# Patient Record
Sex: Female | Born: 1990 | Race: Black or African American | Marital: Single | Smoking: Never smoker
Health system: Southern US, Community
[De-identification: ages and names within clinical notes are randomized; demographics above are authoritative.]

## PROBLEM LIST (undated history)

## (undated) DIAGNOSIS — M228X9 Other disorders of patella, unspecified knee: Secondary | ICD-10-CM

## (undated) DIAGNOSIS — K219 Gastro-esophageal reflux disease without esophagitis: Secondary | ICD-10-CM

## (undated) DIAGNOSIS — M9420 Chondromalacia, unspecified site: Secondary | ICD-10-CM

## (undated) HISTORY — DX: Gastro-esophageal reflux disease without esophagitis: K21.9

## (undated) HISTORY — PX: ADENOIDECTOMY: SUR15

## (undated) HISTORY — DX: Chondromalacia, unspecified site: M94.20

## (undated) HISTORY — PX: TONSILLECTOMY: SUR1361

## (undated) HISTORY — DX: Other disorders of patella, unspecified knee: M22.8X9

---

## 2014-12-02 ENCOUNTER — Emergency Department (INDEPENDENT_AMBULATORY_CARE_PROVIDER_SITE_OTHER): Payer: 59

## 2014-12-02 ENCOUNTER — Emergency Department (INDEPENDENT_AMBULATORY_CARE_PROVIDER_SITE_OTHER)
Admission: EM | Admit: 2014-12-02 | Discharge: 2014-12-02 | Disposition: A | Discharge status: Home / Self Care | Payer: 59 | Source: Home / Self Care | Attending: Family Medicine | Admitting: Family Medicine

## 2014-12-02 ENCOUNTER — Encounter: Payer: Self-pay | Admitting: *Deleted

## 2014-12-02 DIAGNOSIS — R072 Precordial pain: Secondary | ICD-10-CM | POA: Diagnosis not present

## 2014-12-02 DIAGNOSIS — K219 Gastro-esophageal reflux disease without esophagitis: Secondary | ICD-10-CM

## 2014-12-02 DIAGNOSIS — R0789 Other chest pain: Secondary | ICD-10-CM

## 2014-12-02 MED ORDER — OMEPRAZOLE 40 MG PO CPDR: 40.0000 mg | DELAYED_RELEASE_CAPSULE | Freq: Every day | ORAL | Status: AC

## 2014-12-02 NOTE — ED Notes (Signed)
Pt has had intermittent sharp pains in her chest.  4-6 times a day lasting less than a minute on the center of her chest.  Started 2 days ago.  Denies any other symptoms when the pains occur.

## 2014-12-02 NOTE — Discharge Instructions (Signed)
Thank you for coming in today. Call or go to the emergency room if you get worse, have trouble breathing, have chest pains, or palpitations.  Follow up with your doctor.  Take omeprazole daily.   Chest Pain (Nonspecific) It is often hard to give a specific diagnosis for the cause of chest pain. There is always a chance that your pain could be related to something serious, such as a heart attack or a blood clot in the lungs. You need to follow up with your health care provider for further evaluation. CAUSES   Heartburn.  Pneumonia or bronchitis.  Anxiety or stress.  Inflammation around your heart (pericarditis) or lung (pleuritis or pleurisy).  A blood clot in the lung.  A collapsed lung (pneumothorax). It can develop suddenly on its own (spontaneous pneumothorax) or from trauma to the chest.  Shingles infection (herpes zoster virus). The chest wall is composed of bones, muscles, and cartilage. Any of these can be the source of the pain.  The bones can be bruised by injury.  The muscles or cartilage can be strained by coughing or overwork.  The cartilage can be affected by inflammation and become sore (costochondritis). DIAGNOSIS  Lab tests or other studies may be needed to find the cause of your pain. Your health care provider may have you take a test called an ambulatory electrocardiogram (ECG). An ECG records your heartbeat patterns over a 24-hour period. You may also have other tests, such as:  Transthoracic echocardiogram (TTE). During echocardiography, sound waves are used to evaluate how blood flows through your heart.  Transesophageal echocardiogram (TEE).  Cardiac monitoring. This allows your health care provider to monitor your heart rate and rhythm in real time.  Holter monitor. This is a portable device that records your heartbeat and can help diagnose heart arrhythmias. It allows your health care provider to track your heart activity for several days, if  needed.  Stress tests by exercise or by giving medicine that makes the heart beat faster. TREATMENT   Treatment depends on what may be causing your chest pain. Treatment may include:  Acid blockers for heartburn.  Anti-inflammatory medicine.  Pain medicine for inflammatory conditions.  Antibiotics if an infection is present.  You may be advised to change lifestyle habits. This includes stopping smoking and avoiding alcohol, caffeine, and chocolate.  You may be advised to keep your head raised (elevated) when sleeping. This reduces the chance of acid going backward from your stomach into your esophagus. Most of the time, nonspecific chest pain will improve within 2-3 days with rest and mild pain medicine.  HOME CARE INSTRUCTIONS   If antibiotics were prescribed, take them as directed. Finish them even if you start to feel better.  For the next few days, avoid physical activities that bring on chest pain. Continue physical activities as directed.  Do not use any tobacco products, including cigarettes, chewing tobacco, or electronic cigarettes.  Avoid drinking alcohol.  Only take medicine as directed by your health care provider.  Follow your health care provider's suggestions for further testing if your chest pain does not go away.  Keep any follow-up appointments you made. If you do not go to an appointment, you could develop lasting (chronic) problems with pain. If there is any problem keeping an appointment, call to reschedule. SEEK MEDICAL CARE IF:   Your chest pain does not go away, even after treatment.  You have a rash with blisters on your chest.  You have a fever. SEEK IMMEDIATE  MEDICAL CARE IF:   You have increased chest pain or pain that spreads to your arm, neck, jaw, back, or abdomen.  You have shortness of breath.  You have an increasing cough, or you cough up blood.  You have severe back or abdominal pain.  You feel nauseous or vomit.  You have severe  weakness.  You faint.  You have chills. This is an emergency. Do not wait to see if the pain will go away. Get medical help at once. Call your local emergency services (911 in [place] Do not drive yourself to the hospital. MAKE SURE YOU:   Understand these instructions.  Will watch your condition.  Will get help right away if you are not doing well or get worse. Document Released: 07/23/2005 Document Revised: 10/18/2013 Document Reviewed: 05/18/2008 [facility] Patient Information 2015 [place], [place]. This information is not intended to replace advice given to you by your health care provider. Make sure you discuss any questions you have with your health care provider.

## 2014-12-02 NOTE — ED Provider Notes (Signed)
Mallory Huynh is a 24 y.o. female who presents to Urgent Care today for chest pain. Patient has a three-day history of sharp central intermittent chest pain. The pain is not exertional. Patient does note some acid reflux. The pain occurs several times per day and lasts between 30 and 60 seconds. The pain is not associated with palpitations lightheadedness or shortness of breath. No leg swelling or recent immobility. Patient notes that she takes diclofenac twice daily for the last 2 months for left knee pain.   History reviewed. No pertinent past medical history. Past Surgical History  Procedure Laterality Date  . Tonsillectomy    . Adenoidectomy     History  Substance Use Topics  . Smoking status: Never Smoker   . Smokeless tobacco: Never Used  . Alcohol Use: Yes   ROS as above Medications: No current facility-administered medications for this encounter.   Current Outpatient Prescriptions  Medication Sig Dispense Refill  . diclofenac (CATAFLAM) 50 MG tablet Take 50 mg by mouth 2 (two) times daily.    . Probiotic Product (PROBIOTIC DAILY PO) Take by mouth.    Marland Kitchen. omeprazole (PRILOSEC) 40 MG capsule Take 1 capsule (40 mg total) by mouth daily. 30 capsule 1   No Known Allergies   Exam:  BP 122/75 mmHg  Pulse 59  Temp(Src) 98.3 F (36.8 C) (Oral)  Ht 5\' 4"  (1.626 m)  Wt 180 lb (81.647 kg)  BMI 30.88 kg/m2  SpO2 100%  LMP 11/26/2014 Gen: Well NAD HEENT: EOMI,  MMM Lungs: Normal work of breathing. CTABL Heart: RRR no MRG Abd: NABS, Soft. Nondistended, Nontender Exts: Brisk capillary refill, warm and well perfused. No leg edema or calf tenderness bilaterally  Twelve-lead EKG shows sinus bradycardia at 54 bpm. No ST segment elevation or depression. No significant Q waves. QTC 392. Normal EKG.  No results found for this or any previous visit (from the past 24 hour(s)). Dg Chest 2 View  12/02/2014   CLINICAL DATA:  Intermittent chest pain in the mid sternal area for 3 days,  without radiation.  EXAM: CHEST  2 VIEW  COMPARISON:  None.  FINDINGS: Trachea is midline. Heart size normal. Lungs are clear. No pleural fluid.  IMPRESSION: Negative.   Electronically Signed   By: Leanna BattlesMelinda  Blietz M.D.   On: 12/02/2014 15:41    Assessment and Plan: 24 y.o. female with intermittent atypical chest pain. Unclear etiology likely reflux. Recommend discontinue diclofenac and start omeprazole. Follow-up with PCP.  Discussed warning signs or symptoms. Please see discharge instructions. Patient expresses understanding.     Rodolph BongEvan S Corey, MD 12/02/14 1556

## 2014-12-04 ENCOUNTER — Telehealth: Payer: Self-pay | Admitting: Emergency Medicine

## 2014-12-04 NOTE — ED Notes (Signed)
Inquired about patient's status; encourage them to call with questions/concerns.  

## 2015-01-09 ENCOUNTER — Encounter: Payer: Self-pay | Admitting: Family

## 2015-01-09 ENCOUNTER — Ambulatory Visit (INDEPENDENT_AMBULATORY_CARE_PROVIDER_SITE_OTHER): Payer: 59 | Admitting: Family

## 2015-01-09 VITALS — BP 138/82 | HR 66 | Temp 97.6°F | Resp 18 | Ht 64.0 in | Wt 176.8 lb

## 2015-01-09 DIAGNOSIS — R635 Abnormal weight gain: Secondary | ICD-10-CM

## 2015-01-09 NOTE — Patient Instructions (Signed)
Thank you for choosing [facility].  Summary/Instructions:  Please schedule a time for your physical.   Recommend % body fat testing.

## 2015-01-09 NOTE — Progress Notes (Signed)
   Subjective:    Patient ID: Mallory Huynh, female    DOB: 10/06/1991, 24 y.o.   MRN: [id number]  Chief Complaint  Patient presents with  . Establish Care    weight gain, working out and eating healthy     HPI:  Mallory MantisKiara k Deeney is a 24 y.o. female who presents today to establish care and discuss weight gain.  This is a new problem. Associated symptom of weight gain of approximately 16 pounds in about a year. Notes that about 3 years ago she weighed 204 and then was down to 159 for several months and is now ranging in the 175 area with a plus or minus 2-3 pounds. Nutrition consists of lean meats, fruits, vegetables, decreased saturated fat, limited fast foods and processed foods. Notes limited caffeine. Works out 4-5 times per week with a mixture of cardio and resistance training with the average session lasting 2-3 hours.   No Known Allergies  Current Outpatient Prescriptions on File Prior to Visit  Medication Sig Dispense Refill  . omeprazole (PRILOSEC) 40 MG capsule Take 1 capsule (40 mg total) by mouth daily. 30 capsule 1  . Probiotic Product (PROBIOTIC DAILY PO) Take by mouth.     No current facility-administered medications on file prior to visit.    Past Medical History  Diagnosis Date  . GERD (gastroesophageal reflux disease)   . Chondromalacia   . Patellar tracking disorder     Past Surgical History  Procedure Laterality Date  . Tonsillectomy    . Adenoidectomy      Family History  Problem Relation Age of Onset  . Hypertension Mother   . Diabetes Father   . Alzheimer's disease Maternal Grandmother   . Lung cancer Maternal Grandfather   . Diabetes Paternal Grandmother   . Lung cancer Paternal Grandfather     History   Social History  . Marital Status: Single    Spouse Name: N/A  . Number of Children: N/A  . Years of Education: N/A   Occupational History  . Not on file.   Social History Main Topics  . Smoking status: Never Smoker   . Smokeless  tobacco: Never Used  . Alcohol Use: Yes     Comment: occasionally  . Drug Use: No  . Sexual Activity: Yes    Birth Control/ Protection: None   Other Topics Concern  . Not on file   Social History Narrative    Review of Systems  Constitutional: Positive for activity change. Negative for appetite change.      Objective:    BP 138/82 mmHg  Pulse 66  Temp(Src) 97.6 F (36.4 C) (Oral)  Resp 18  Ht 5\' 4"  (1.626 m)  Wt 176 lb 12.8 oz (80.196 kg)  BMI 30.33 kg/m2  SpO2 99% Nursing note and vital signs reviewed.  Physical Exam  Constitutional: She is oriented to person, place, and time. She appears well-developed and well-nourished. No distress.  Cardiovascular: Normal rate, regular rhythm, normal heart sounds and intact distal pulses.   Pulmonary/Chest: Effort normal and breath sounds normal.  Neurological: She is alert and oriented to person, place, and time.  Skin: Skin is warm and dry.  Psychiatric: She has a normal mood and affect. Her behavior is normal. Judgment and thought content normal.       Assessment & Plan:

## 2015-01-09 NOTE — Assessment & Plan Note (Signed)
Weight gain is a result of decreased physical activity from 4-5 hours per day down to 2-3 hours per day. Patient's BMI indicates obesity, however given patient's muscular mass doubt that this is an accurate assessment of her current weight. Recommend percent body fat testing to determine her current status. Discussed increasing caloric intake to meet metabolic demands and needs. Continue current healthy lifestyle trends. We'll check lab work at her annual physical.

## 2015-01-09 NOTE — Progress Notes (Signed)
Pre visit review using our clinic review tool, if applicable. No additional management support is needed unless otherwise documented below in the visit note. 

## 2015-01-10 ENCOUNTER — Ambulatory Visit (INDEPENDENT_AMBULATORY_CARE_PROVIDER_SITE_OTHER): Payer: 59 | Admitting: Family

## 2015-01-10 ENCOUNTER — Encounter: Payer: Self-pay | Admitting: Family

## 2015-01-10 VITALS — BP 124/72 | HR 59 | Temp 98.3°F | Resp 18 | Ht 64.0 in | Wt 176.0 lb

## 2015-01-10 DIAGNOSIS — Z0000 Encounter for general adult medical examination without abnormal findings: Secondary | ICD-10-CM

## 2015-01-10 NOTE — Progress Notes (Signed)
Pre visit review using our clinic review tool, if applicable. No additional management support is needed unless otherwise documented below in the visit note. 

## 2015-01-10 NOTE — Assessment & Plan Note (Signed)
1) Anticipatory Guidance: Discussed importance of wearing a seatbelt while driving and not texting while driving; changing batteries in smoke detector at least once annually; wearing suntan lotion when outside; eating a balanced and moderate diet; getting physical activity at least 30 minutes per day.  2) Immunizations / Screenings / Labs:  Discussed Gardasil with patient. Indicates urinary has completed it. All immunizations are up-to-date per recommendations. All screenings are up-to-date per recommendations.Obtain CBC, BMET, Lipid profile, HIV and TSH.    Overall well exam. Patient has minimal risk factors at this time. Continue current healthy lifestyle and exercise choices. BMI indicates patient is overweight however she is of significant muscle mass. Will work to find percent body fat testing for the patient. Follow-up prevention exam in one year.

## 2015-01-10 NOTE — Patient Instructions (Signed)
Thank you for choosing [facility].  Summary/Instructions:  Please stop by the lab on the basement level of the building for your blood work. Your results will be released to MyChart (or called to you) after review, usually within 72hours after test completion. If any changes need to be made, you will be notified at that same time.  Health Maintenance Adopting a healthy lifestyle and getting preventive care can go a long way to promote health and wellness. Talk with your health care provider about what schedule of regular examinations is right for you. This is a good chance for you to check in with your provider about disease prevention and staying healthy. In between checkups, there are plenty of things you can do on your own. Experts have done a lot of research about which lifestyle changes and preventive measures are most likely to keep you healthy. Ask your health care provider for more information. WEIGHT AND DIET  Eat a healthy diet  Be sure to include plenty of vegetables, fruits, low-fat dairy products, and lean protein.  Do not eat a lot of foods high in solid fats, added sugars, or salt.  Get regular exercise. This is one of the most important things you can do for your health.  Most adults should exercise for at least 150 minutes each week. The exercise should increase your heart rate and make you sweat (moderate-intensity exercise).  Most adults should also do strengthening exercises at least twice a week. This is in addition to the moderate-intensity exercise.  Maintain a healthy weight  Body mass index (BMI) is a measurement that can be used to identify possible weight problems. It estimates body fat based on height and weight. Your health care provider can help determine your BMI and help you achieve or maintain a healthy weight.  For females 20 years of age and older:   A BMI below 18.5 is considered underweight.  A BMI of 18.5 to 24.9 is normal.  A BMI of 25  to 29.9 is considered overweight.  A BMI of 30 and above is considered obese.  Watch levels of cholesterol and blood lipids  You should start having your blood tested for lipids and cholesterol at 24 years of age, then have this test every 5 years.  You may need to have your cholesterol levels checked more often if:  Your lipid or cholesterol levels are high.  You are older than 24 years of age.  You are at high risk for heart disease.  CANCER SCREENING   Lung Cancer  Lung cancer screening is recommended for adults 55-80 years old who are at high risk for lung cancer because of a history of smoking.  A yearly low-dose CT scan of the lungs is recommended for people who:  Currently smoke.  Have quit within the past 15 years.  Have at least a 30-pack-year history of smoking. A pack year is smoking an average of one pack of cigarettes a day for 1 year.  Yearly screening should continue until it has been 15 years since you quit.  Yearly screening should stop if you develop a health problem that would prevent you from having lung cancer treatment.  Breast Cancer  Practice breast self-awareness. This means understanding how your breasts normally appear and feel.  It also means doing regular breast self-exams. Let your health care provider know about any changes, no matter how small.  If you are in your 20s or 30s, you should have a clinical breast exam (  breast exam (CBE) by a health care provider every 1-3 years as part of a regular health exam.  If you are 40 or older, have a CBE every year. Also consider having a breast X-ray (mammogram) every year.  If you have a family history of breast cancer, talk to your health care provider about genetic screening.  If you are at high risk for breast cancer, talk to your health care provider about having an MRI and a mammogram every year.  Breast cancer gene (BRCA) assessment is recommended for women who have family members with BRCA-related  cancers. BRCA-related cancers include:  Breast.  Ovarian.  Tubal.  Peritoneal cancers.  Results of the assessment will determine the need for genetic counseling and BRCA1 and BRCA2 testing. Cervical Cancer Routine pelvic examinations to screen for cervical cancer are no longer recommended for nonpregnant women who are considered low risk for cancer of the pelvic organs (ovaries, uterus, and vagina) and who do not have symptoms. A pelvic examination may be necessary if you have symptoms including those associated with pelvic infections. Ask your health care provider if a screening pelvic exam is right for you.   The Pap test is the screening test for cervical cancer for women who are considered at risk.  If you had a hysterectomy for a problem that was not cancer or a condition that could lead to cancer, then you no longer need Pap tests.  If you are older than 65 years, and you have had normal Pap tests for the past 10 years, you no longer need to have Pap tests.  If you have had past treatment for cervical cancer or a condition that could lead to cancer, you need Pap tests and screening for cancer for at least 20 years after your treatment.  If you no longer get a Pap test, assess your risk factors if they change (such as having a new sexual partner). This can affect whether you should start being screened again.  Some women have medical problems that increase their chance of getting cervical cancer. If this is the case for you, your health care provider may recommend more frequent screening and Pap tests.  The human papillomavirus (HPV) test is another test that may be used for cervical cancer screening. The HPV test looks for the virus that can cause cell changes in the cervix. The cells collected during the Pap test can be tested for HPV.  The HPV test can be used to screen women 30 years of age and older. Getting tested for HPV can extend the interval between normal Pap tests from  three to five years.  An HPV test also should be used to screen women of any age who have unclear Pap test results.  After 24 years of age, women should have HPV testing as often as Pap tests.  Colorectal Cancer  This type of cancer can be detected and often prevented.  Routine colorectal cancer screening usually begins at 24 years of age and continues through 24 years of age.  Your health care provider may recommend screening at an earlier age if you have risk factors for colon cancer.  Your health care provider may also recommend using home test kits to check for hidden blood in the stool.  A small camera at the end of a tube can be used to examine your colon directly (sigmoidoscopy or colonoscopy). This is done to check for the earliest forms of colorectal cancer.  Routine screening usually begins at age   50.  Direct examination of the colon should be repeated every 5-10 years through 24 years of age. However, you may need to be screened more often if early forms of precancerous polyps or small growths are found. Skin Cancer  Check your skin from head to toe regularly.  Tell your health care provider about any new moles or changes in moles, especially if there is a change in a mole's shape or color.  Also tell your health care provider if you have a mole that is larger than the size of a pencil eraser.  Always use sunscreen. Apply sunscreen liberally and repeatedly throughout the day.  Protect yourself by wearing long sleeves, pants, a wide-brimmed hat, and sunglasses whenever you are outside. HEART DISEASE, DIABETES, AND HIGH BLOOD PRESSURE   Have your blood pressure checked at least every 1-2 years. High blood pressure causes heart disease and increases the risk of stroke.  If you are between 70 years and 59 years old, ask your health care provider if you should take aspirin to prevent strokes.  Have regular diabetes screenings. This involves taking a blood sample to check  your fasting blood sugar level.  If you are at a normal weight and have a low risk for diabetes, have this test once every three years after 24 years of age.  If you are overweight and have a high risk for diabetes, consider being tested at a younger age or more often. PREVENTING INFECTION  Hepatitis B  If you have a higher risk for hepatitis B, you should be screened for this virus. You are considered at high risk for hepatitis B if:  You were born in a country where hepatitis B is common. Ask your health care provider which countries are considered high risk.  Your parents were born in a high-risk country, and you have not been immunized against hepatitis B (hepatitis B vaccine).  You have HIV or AIDS.  You use needles to inject street drugs.  You live with someone who has hepatitis B.  You have had sex with someone who has hepatitis B.  You get hemodialysis treatment.  You take certain medicines for conditions, including cancer, organ transplantation, and autoimmune conditions. Hepatitis C  Blood testing is recommended for:  Everyone born from [birth year] through 1965.  Anyone with known risk factors for hepatitis C. Sexually transmitted infections (STIs)  You should be screened for sexually transmitted infections (STIs) including gonorrhea and chlamydia if:  You are sexually active and are younger than 24 years of age.  You are older than 24 years of age and your health care provider tells you that you are at risk for this type of infection.  Your sexual activity has changed since you were last screened and you are at an increased risk for chlamydia or gonorrhea. Ask your health care provider if you are at risk.  If you do not have HIV, but are at risk, it may be recommended that you take a prescription medicine daily to prevent HIV infection. This is called pre-exposure prophylaxis (PrEP). You are considered at risk if:  You are sexually active and do not regularly use  condoms or know the HIV status of your partner(s).  You take drugs by injection.  You are sexually active with a partner who has HIV. Talk with your health care provider about whether you are at high risk of being infected with HIV. If you choose to begin PrEP, you should first be tested for HIV. You should  then be tested every 3 months for as long as you are taking PrEP.  PREGNANCY   If you are premenopausal and you may become pregnant, ask your health care provider about preconception counseling.  If you may become pregnant, take 400 to 800 micrograms (mcg) of folic acid every day.  If you want to prevent pregnancy, talk to your health care provider about birth control (contraception). OSTEOPOROSIS AND MENOPAUSE   Osteoporosis is a disease in which the bones lose minerals and strength with aging. This can result in serious bone fractures. Your risk for osteoporosis can be identified using a bone density scan.  If you are 60 years of age or older, or if you are at risk for osteoporosis and fractures, ask your health care provider if you should be screened.  Ask your health care provider whether you should take a calcium or vitamin D supplement to lower your risk for osteoporosis.  Menopause may have certain physical symptoms and risks.  Hormone replacement therapy may reduce some of these symptoms and risks. Talk to your health care provider about whether hormone replacement therapy is right for you.  HOME CARE INSTRUCTIONS   Schedule regular health, dental, and eye exams.  Stay current with your immunizations.   Do not use any tobacco products including cigarettes, chewing tobacco, or electronic cigarettes.  If you are pregnant, do not drink alcohol.  If you are breastfeeding, limit how much and how often you drink alcohol.  Limit alcohol intake to no more than 1 drink per day for nonpregnant women. One drink equals 12 ounces of beer, 5 ounces of wine, or 1 ounces of hard  liquor.  Do not use street drugs.  Do not share needles.  Ask your health care provider for help if you need support or information about quitting drugs.  Tell your health care provider if you often feel depressed.  Tell your health care provider if you have ever been abused or do not feel safe at home. Document Released: 04/28/2011 Document Revised: 02/27/2014 Document Reviewed: 09/14/2013 [facility] Patient Information 2015 [place], [place]. This information is not intended to replace advice given to you by your health care provider. Make sure you discuss any questions you have with your health care provider.

## 2015-01-10 NOTE — Progress Notes (Signed)
Subjective:    Patient ID: Mallory Huynh, female    DOB: 1991-05-25, 24 y.o.   MRN: [id number]  Chief Complaint  Patient presents with  . CPE    Not fasting    HPI:  Mallory Huynh is a 24 y.o. female who presents today for an annual wellness visit.   1) Health Maintenance -   Diet - Averaging about 4 meals per day; fruits, vegetables, chicken, and 1 meal as a protein shake or bar; Caffeine only when she works 3 days per week.   Exercise - 4 times per week and a mixture of cardio/resistance with the average session between 1-1.5 hours.     2) Preventative Exams / Immunizations:  Dental -- Up to date  Vision -- Up to date   Health Maintenance  Topic Date Due  . HIV Screening  07/17/2006  . TETANUS/TDAP  07/17/2010  . INFLUENZA VACCINE  05/28/2015  . PAP SMEAR  08/30/2016     There is no immunization history on file for this patient.  No Known Allergies  Current Outpatient Prescriptions on File Prior to Visit  Medication Sig Dispense Refill  . omeprazole (PRILOSEC) 40 MG capsule Take 1 capsule (40 mg total) by mouth daily. 30 capsule 1  . Probiotic Product (PROBIOTIC DAILY PO) Take by mouth.     No current facility-administered medications on file prior to visit.    Past Medical History  Diagnosis Date  . GERD (gastroesophageal reflux disease)   . Chondromalacia   . Patellar tracking disorder     Past Surgical History  Procedure Laterality Date  . Tonsillectomy    . Adenoidectomy      Family History  Problem Relation Age of Onset  . Hypertension Mother   . Diabetes Father   . Alzheimer's disease Maternal Grandmother   . Lung cancer Maternal Grandfather   . Diabetes Paternal Grandmother   . Lung cancer Paternal Grandfather     History   Social History  . Marital Status: Single    Spouse Name: N/A  . Number of Children: 0  . Years of Education: 16   Occupational History  . NICU Nurse    Social History Main Topics  . Smoking status:  Never Smoker   . Smokeless tobacco: Never Used  . Alcohol Use: Yes     Comment: occasionally  . Drug Use: No  . Sexual Activity: Yes    Birth Control/ Protection: None   Other Topics Concern  . Not on file   Social History Narrative   Born in [place] and raised in [place]. Fun: Workout, crafts   Denies religious beliefs that would effect health care.      Review of Systems  Constitutional: Denies fever, chills, fatigue, or significant weight gain/loss. HENT: Head: Denies headache or neck pain Ears: Denies changes in hearing, ringing in ears, earache, drainage Nose: Denies discharge, stuffiness, itching, nosebleed, sinus pain Throat: Denies sore throat, hoarseness, dry mouth, sores, thrush Eyes: Denies loss/changes in vision, pain, redness, blurry/double vision, flashing lights Cardiovascular: Denies chest pain/discomfort, tightness, palpitations, shortness of breath with activity, difficulty lying down, swelling, sudden awakening with shortness of breath Respiratory: Denies shortness of breath, cough, sputum production, wheezing Gastrointestinal: Denies dysphasia, heartburn, change in appetite, nausea, change in bowel habits, rectal bleeding, constipation, diarrhea, yellow skin or eyes Genitourinary: Denies frequency, urgency, burning/pain, blood in urine, incontinence, change in urinary strength. Musculoskeletal: Denies muscle/joint pain, stiffness, back pain, redness or swelling of joints, trauma Skin:  Denies rashes, lumps, itching, dryness, color changes, or hair/nail changes Neurological: Denies dizziness, fainting, seizures, weakness, numbness, tingling, tremor Psychiatric - Denies nervousness, stress, depression or memory loss Endocrine: Denies heat or cold intolerance, sweating, frequent urination, excessive thirst, changes in appetite Hematologic: Denies ease of bruising or bleeding     Objective:     BP 124/72 mmHg  Pulse 59  Temp(Src) 98.3 F (36.8 C) (Oral)   Resp 18  Ht 5\' 4"  (1.626 m)  Wt 176 lb (79.833 kg)  BMI 30.20 kg/m2  SpO2 98% Nursing note and vital signs reviewed.  Physical Exam  Constitutional: She is oriented to person, place, and time. She appears well-developed and well-nourished.  HENT:  Head: Normocephalic.  Right Ear: Hearing, tympanic membrane, external ear and ear canal normal.  Left Ear: Hearing, tympanic membrane, external ear and ear canal normal.  Nose: Nose normal.  Mouth/Throat: Uvula is midline, oropharynx is clear and moist and mucous membranes are normal.  Eyes: Conjunctivae and EOM are normal. Pupils are equal, round, and reactive to light.  Neck: Neck supple. No JVD present. No tracheal deviation present. No thyromegaly present.  Cardiovascular: Normal rate, regular rhythm, normal heart sounds and intact distal pulses.   Pulmonary/Chest: Effort normal and breath sounds normal.  Abdominal: Soft. Bowel sounds are normal. She exhibits no distension and no mass. There is no tenderness. There is no rebound and no guarding.  Musculoskeletal: Normal range of motion. She exhibits no edema or tenderness.  Lymphadenopathy:    She has no cervical adenopathy.  Neurological: She is alert and oriented to person, place, and time. She has normal reflexes. No cranial nerve deficit. She exhibits normal muscle tone. Coordination normal.  Skin: Skin is warm and dry.  Psychiatric: She has a normal mood and affect. Her behavior is normal. Judgment and thought content normal.       Assessment & Plan:

## 2015-01-22 ENCOUNTER — Telehealth: Payer: Self-pay | Admitting: Family

## 2015-01-22 NOTE — Telephone Encounter (Signed)
Pt informed

## 2015-01-22 NOTE — Telephone Encounter (Signed)
Please inform patient that I found a place for her to have her body fat tested the information is as follows:  [address] [place] [address] [email or web address]

## 2015-01-29 ENCOUNTER — Other Ambulatory Visit (INDEPENDENT_AMBULATORY_CARE_PROVIDER_SITE_OTHER): Payer: 59

## 2015-01-29 DIAGNOSIS — Z0000 Encounter for general adult medical examination without abnormal findings: Secondary | ICD-10-CM

## 2015-01-29 LAB — CBC
HCT: 42 % (ref 36.0–46.0)
HEMOGLOBIN: 14.1 g/dL (ref 12.0–15.0)
MCHC: 33.5 g/dL (ref 30.0–36.0)
MCV: 77.4 fl — ABNORMAL LOW (ref 78.0–100.0)
Platelets: 194 10*3/uL (ref 150.0–400.0)
RBC: 5.42 Mil/uL — ABNORMAL HIGH (ref 3.87–5.11)
RDW: 14.2 % (ref 11.5–15.5)
WBC: 4 10*3/uL (ref 4.0–10.5)

## 2015-01-29 LAB — BASIC METABOLIC PANEL
BUN: 15 mg/dL (ref 6–23)
CO2: 29 mEq/L (ref 19–32)
Calcium: 9.6 mg/dL (ref 8.4–10.5)
Chloride: 104 mEq/L (ref 96–112)
Creatinine, Ser: 1.22 mg/dL — ABNORMAL HIGH (ref 0.40–1.20)
GFR: 69.92 mL/min (ref >60.00)
Glucose, Bld: 85 mg/dL (ref 70–99)
Potassium: 4.1 mEq/L (ref 3.5–5.1)
SODIUM: 139 meq/L (ref 135–145)

## 2015-01-29 LAB — LIPID PANEL
CHOLESTEROL: 122 mg/dL (ref 0–200)
HDL: 38.2 mg/dL — ABNORMAL LOW (ref >39.00)
LDL Cholesterol: 71 mg/dL (ref 0–99)
NONHDL: 83.8
Total CHOL/HDL Ratio: 3
Triglycerides: 66 mg/dL (ref 0.0–149.0)
VLDL: 13.2 mg/dL (ref 0.0–40.0)

## 2015-01-29 LAB — TSH: TSH: 4.46 u[IU]/mL (ref 0.35–4.50)

## 2015-01-29 LAB — HEMOGLOBIN A1C: Hgb A1c MFr Bld: 5.6 % (ref 4.6–6.5)

## 2015-01-30 ENCOUNTER — Telehealth: Payer: Self-pay | Admitting: Family

## 2015-01-30 DIAGNOSIS — R7989 Other specified abnormal findings of blood chemistry: Secondary | ICD-10-CM

## 2015-01-30 LAB — HIV ANTIBODY (ROUTINE TESTING W REFLEX): HIV 1&2 Ab, 4th Generation: NONREACTIVE

## 2015-01-30 NOTE — Telephone Encounter (Signed)
LVM for pt to call back.

## 2015-01-30 NOTE — Telephone Encounter (Signed)
Please inform the patient that her lab results show that her cholesterol, TSH and A1c are normal and her HIV was negative. Her red blood cells are slightly small indicating potential iron deficiency, however no treatment is needed at this particular point. Otherwise I would like to retest her kidney function in a month or so because one of the numbers came back slightly elevated. There is no reason for alarm with this and is possibly an anomaly.

## 2015-01-31 NOTE — Telephone Encounter (Signed)
Pt aware of results. States that her creatinine level has been high before she thinks due to eating a lot of protein bars and shakes. She will come back to get lab work redone.

## 2015-01-31 NOTE — Telephone Encounter (Signed)
Patient returned your call.

## 2015-02-08 ENCOUNTER — Ambulatory Visit (INDEPENDENT_AMBULATORY_CARE_PROVIDER_SITE_OTHER): Payer: 59 | Admitting: Family

## 2015-02-08 ENCOUNTER — Encounter: Payer: Self-pay | Admitting: Family

## 2015-02-08 VITALS — BP 132/70 | HR 66 | Temp 98.2°F | Resp 18 | Ht 64.0 in | Wt 174.0 lb

## 2015-02-08 DIAGNOSIS — J069 Acute upper respiratory infection, unspecified: Secondary | ICD-10-CM

## 2015-02-08 MED ORDER — AZITHROMYCIN 250 MG PO TABS
ORAL_TABLET | ORAL | Status: AC
Start: 2015-02-08 — End: ?

## 2015-02-08 NOTE — Progress Notes (Signed)
   Subjective:    Patient ID: Mallory Huynh, female    DOB: 08/14/1991, 24 y.o.   MRN: [id number]  Chief Complaint  Patient presents with  . Cough    x5 days, productive cough, sore throat, and hoarse, woke up today and right ear was hurting    HPI:  Mallory MantisKiara k Hereford is a 24 y.o. female who presents today for an acute visit.  This is a new problem. Associated symptoms of productive, sore throat, right ear pain has been going on for about 5 days. Modifying factors include OTC Tussin and Tylenol Cold and Flu which helped to relieve symptoms. Denies any antibiotic use recently.   No Known Allergies   Current Outpatient Prescriptions on File Prior to Visit  Medication Sig Dispense Refill  . omeprazole (PRILOSEC) 40 MG capsule Take 1 capsule (40 mg total) by mouth daily. 30 capsule 1  . Probiotic Product (PROBIOTIC DAILY PO) Take by mouth.     No current facility-administered medications on file prior to visit.     Review of Systems  Constitutional: Negative for fever and chills.  HENT: Positive for congestion, ear pain and sore throat. Negative for sinus pressure.   Respiratory: Positive for cough. Negative for chest tightness and shortness of breath.   Gastrointestinal: Negative for nausea, vomiting and diarrhea.  Neurological: Negative for headaches.      Objective:    BP 132/70 mmHg  Pulse 66  Temp(Src) 98.2 F (36.8 C) (Oral)  Resp 18  Ht 5\' 4"  (1.626 m)  Wt 174 lb (78.926 kg)  BMI 29.85 kg/m2  SpO2 98% Nursing note and vital signs reviewed.  Physical Exam  Constitutional: She is oriented to person, place, and time. She appears well-developed and well-nourished. No distress.  HENT:  Right Ear: Hearing, tympanic membrane, external ear and ear canal normal.  Left Ear: Hearing, tympanic membrane, external ear and ear canal normal.  Nose: Nose normal. Right sinus exhibits no maxillary sinus tenderness and no frontal sinus tenderness. Left sinus exhibits no maxillary  sinus tenderness and no frontal sinus tenderness.  Mouth/Throat: Uvula is midline, oropharynx is clear and moist and mucous membranes are normal.  Cardiovascular: Normal rate, regular rhythm, normal heart sounds and intact distal pulses.   Pulmonary/Chest: Effort normal and breath sounds normal.  Neurological: She is alert and oriented to person, place, and time.  Skin: Skin is warm and dry.  Psychiatric: She has a normal mood and affect. Her behavior is normal. Judgment and thought content normal.       Assessment & Plan:

## 2015-02-08 NOTE — Patient Instructions (Signed)
Thank you for choosing [facility].  Summary/Instructions:  Your prescription(s) have been submitted to your pharmacy or been printed and provided for you. Please take as directed and contact our office if you believe you are having problem(s) with the medication(s) or have any questions.  If your symptoms worsen or fail to improve, please contact our office for further instruction, or in case of emergency go directly to the emergency room at the closest medical facility.    Upper Respiratory Infection, Adult An upper respiratory infection (URI) is also sometimes known as the common cold. The upper respiratory tract includes the nose, sinuses, throat, trachea, and bronchi. Bronchi are the airways leading to the lungs. Most people improve within 1 week, but symptoms can last up to 2 weeks. A residual cough may last even longer.  CAUSES Many different viruses can infect the tissues lining the upper respiratory tract. The tissues become irritated and inflamed and often become very moist. Mucus production is also common. A cold is contagious. You can easily spread the virus to others by oral contact. This includes kissing, sharing a glass, coughing, or sneezing. Touching your mouth or nose and then touching a surface, which is then touched by another person, can also spread the virus. SYMPTOMS  Symptoms typically develop 1 to 3 days after you come in contact with a cold virus. Symptoms vary from person to person. They may include:  Runny nose.  Sneezing.  Nasal congestion.  Sinus irritation.  Sore throat.  Loss of voice (laryngitis).  Cough.  Fatigue.  Muscle aches.  Loss of appetite.  Headache.  Low-grade fever. DIAGNOSIS  You might diagnose your own cold based on familiar symptoms, since most people get a cold 2 to 3 times a year. Your caregiver can confirm this based on your exam. Most importantly, your caregiver can check that your symptoms are not due to another  disease such as strep throat, sinusitis, pneumonia, asthma, or epiglottitis. Blood tests, throat tests, and X-rays are not necessary to diagnose a common cold, but they may sometimes be helpful in excluding other more serious diseases. Your caregiver will decide if any further tests are required. RISKS AND COMPLICATIONS  You may be at risk for a more severe case of the common cold if you smoke cigarettes, have chronic heart disease (such as heart failure) or lung disease (such as asthma), or if you have a weakened immune system. The very young and very old are also at risk for more serious infections. Bacterial sinusitis, middle ear infections, and bacterial pneumonia can complicate the common cold. The common cold can worsen asthma and chronic obstructive pulmonary disease (COPD). Sometimes, these complications can require emergency medical care and may be life-threatening. PREVENTION  The best way to protect against getting a cold is to practice good hygiene. Avoid oral or hand contact with people with cold symptoms. Wash your hands often if contact occurs. There is no clear evidence that vitamin C, vitamin E, echinacea, or exercise reduces the chance of developing a cold. However, it is always recommended to get plenty of rest and practice good nutrition. TREATMENT  Treatment is directed at relieving symptoms. There is no cure. Antibiotics are not effective, because the infection is caused by a virus, not by bacteria. Treatment may include:  Increased fluid intake. Sports drinks offer valuable electrolytes, sugars, and fluids.  Breathing heated mist or steam (vaporizer or shower).  Eating chicken soup or other clear broths, and maintaining good nutrition.  Getting plenty of   rest.  Using gargles or lozenges for comfort.  Controlling fevers with ibuprofen or acetaminophen as directed by your caregiver.  Increasing usage of your inhaler if you have asthma. Zinc gel and zinc lozenges, taken in  the first 24 hours of the common cold, can shorten the duration and lessen the severity of symptoms. Pain medicines may help with fever, muscle aches, and throat pain. A variety of non-prescription medicines are available to treat congestion and runny nose. Your caregiver can make recommendations and may suggest nasal or lung inhalers for other symptoms.  HOME CARE INSTRUCTIONS   Only take over-the-counter or prescription medicines for pain, discomfort, or fever as directed by your caregiver.  Use a warm mist humidifier or inhale steam from a shower to increase air moisture. This may keep secretions moist and make it easier to breathe.  Drink enough water and fluids to keep your urine clear or pale yellow.  Rest as needed.  Return to work when your temperature has returned to normal or as your caregiver advises. You may need to stay home longer to avoid infecting others. You can also use a face mask and careful hand washing to prevent spread of the virus. SEEK MEDICAL CARE IF:   After the first few days, you feel you are getting worse rather than better.  You need your caregiver's advice about medicines to control symptoms.  You develop chills, worsening shortness of breath, or brown or red sputum. These may be signs of pneumonia.  You develop yellow or brown nasal discharge or pain in the face, especially when you bend forward. These may be signs of sinusitis.  You develop a fever, swollen neck glands, pain with swallowing, or white areas in the back of your throat. These may be signs of strep throat. SEEK IMMEDIATE MEDICAL CARE IF:   You have a fever.  You develop severe or persistent headache, ear pain, sinus pain, or chest pain.  You develop wheezing, a prolonged cough, cough up blood, or have a change in your usual mucus (if you have chronic lung disease).  You develop sore muscles or a stiff neck. Document Released: 04/08/2001 Document Revised: 01/05/2012 Document Reviewed:  01/18/2014 [employer] Patient Information 2015 [employer]. This information is not intended to replace advice given to you by your health care provider. Make sure you discuss any questions you have with your health care provider.   

## 2015-02-08 NOTE — Progress Notes (Signed)
Pre visit review using our clinic review tool, if applicable. No additional management support is needed unless otherwise documented below in the visit note. 

## 2015-02-08 NOTE — Assessment & Plan Note (Signed)
Symptoms and exam consistent with upper respiratory infection. Start azithromycin. Continue over-the-counter medications as needed for central relief and supportive care. Follow up if symptoms worsen or not improve.

## 2015-10-31 DIAGNOSIS — B308 Other viral conjunctivitis: Secondary | ICD-10-CM | POA: Diagnosis not present

## 2015-12-07 DIAGNOSIS — H52223 Regular astigmatism, bilateral: Secondary | ICD-10-CM | POA: Diagnosis not present

## 2016-04-08 IMAGING — DX DG CHEST 2V
2 series · 2 of 2 positions shown · non-contrast
Comparison: None.

CLINICAL DATA: Intermittent chest pain in the mid sternal area for
3 days, without radiation.

EXAM:
CHEST  2 VIEW

[chest pa]
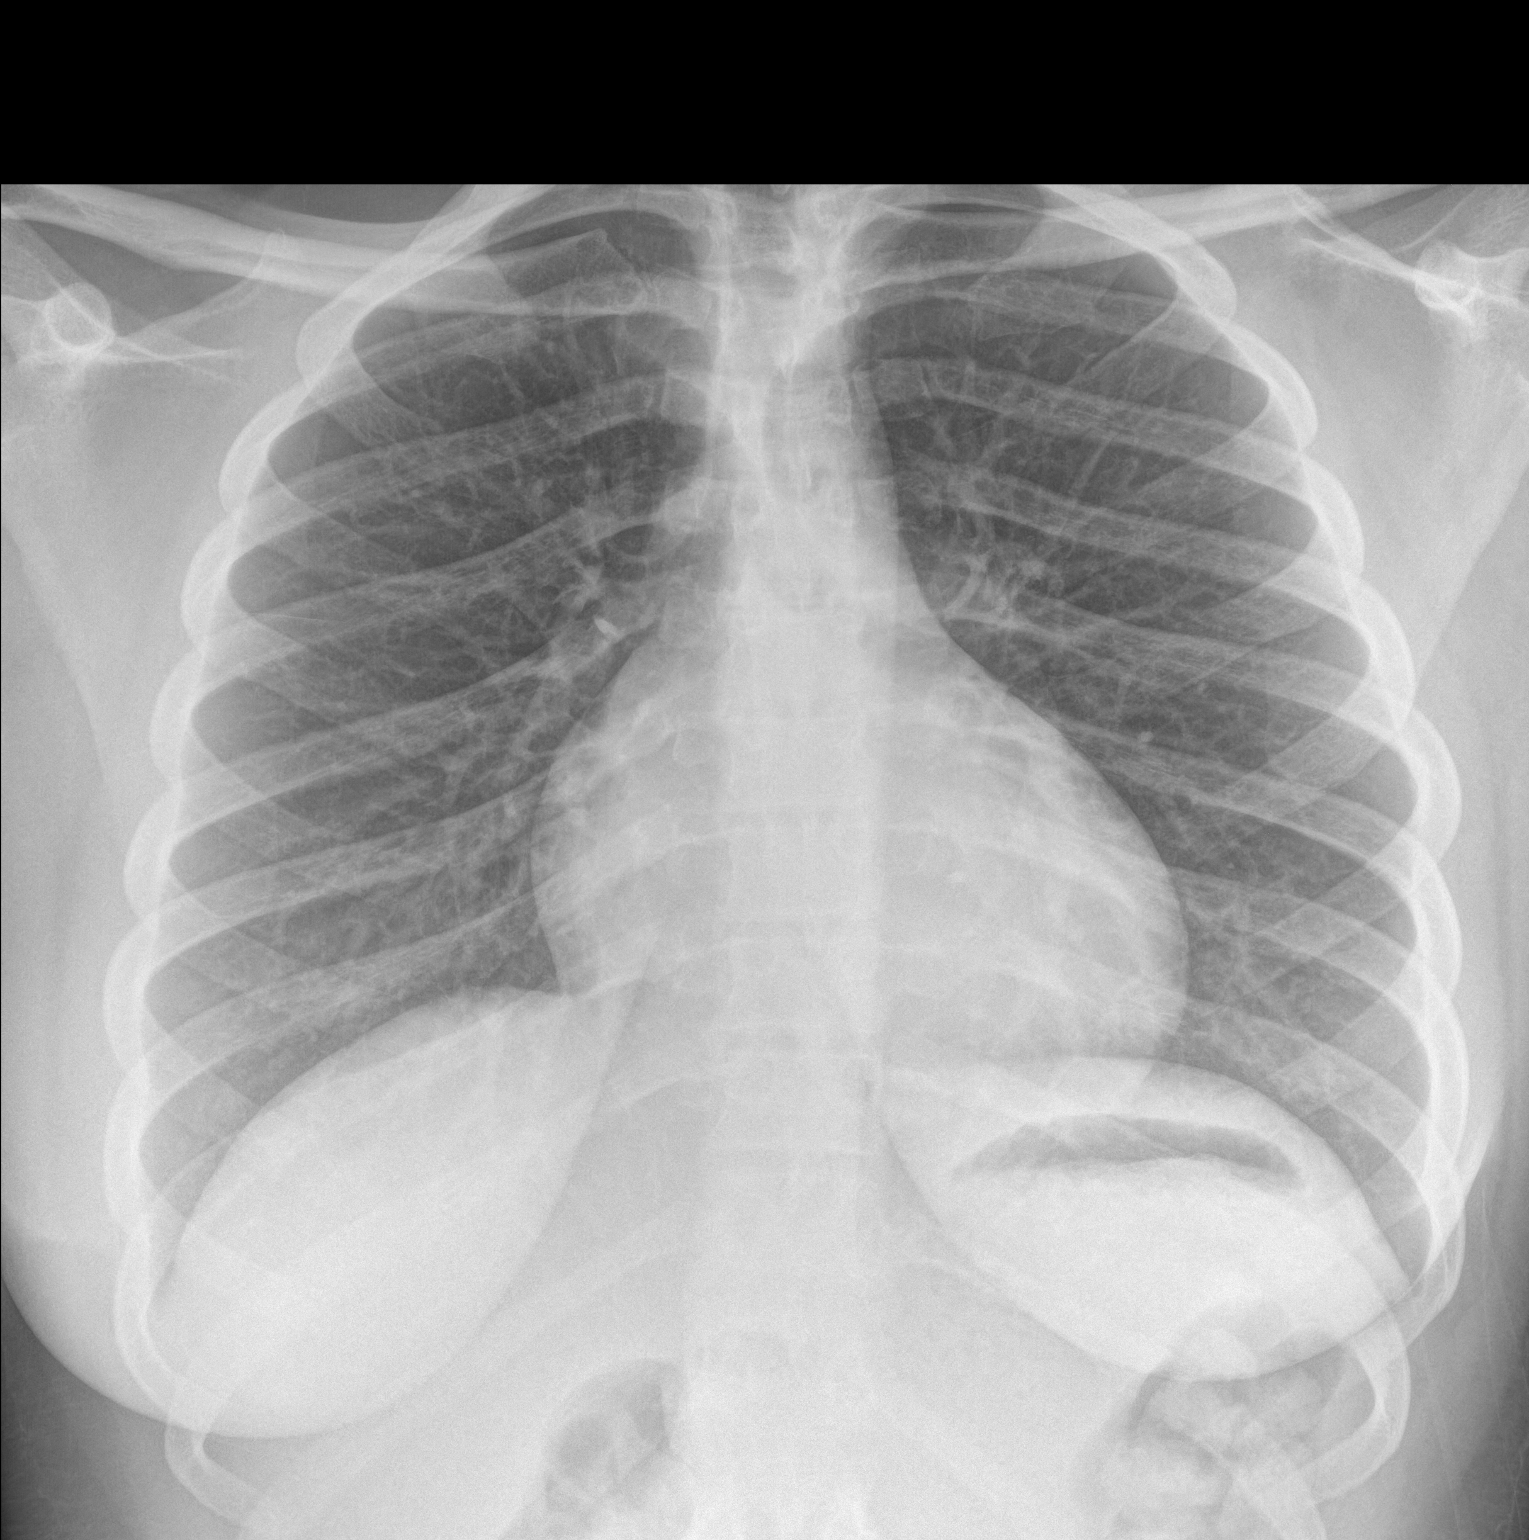

[chest lat]
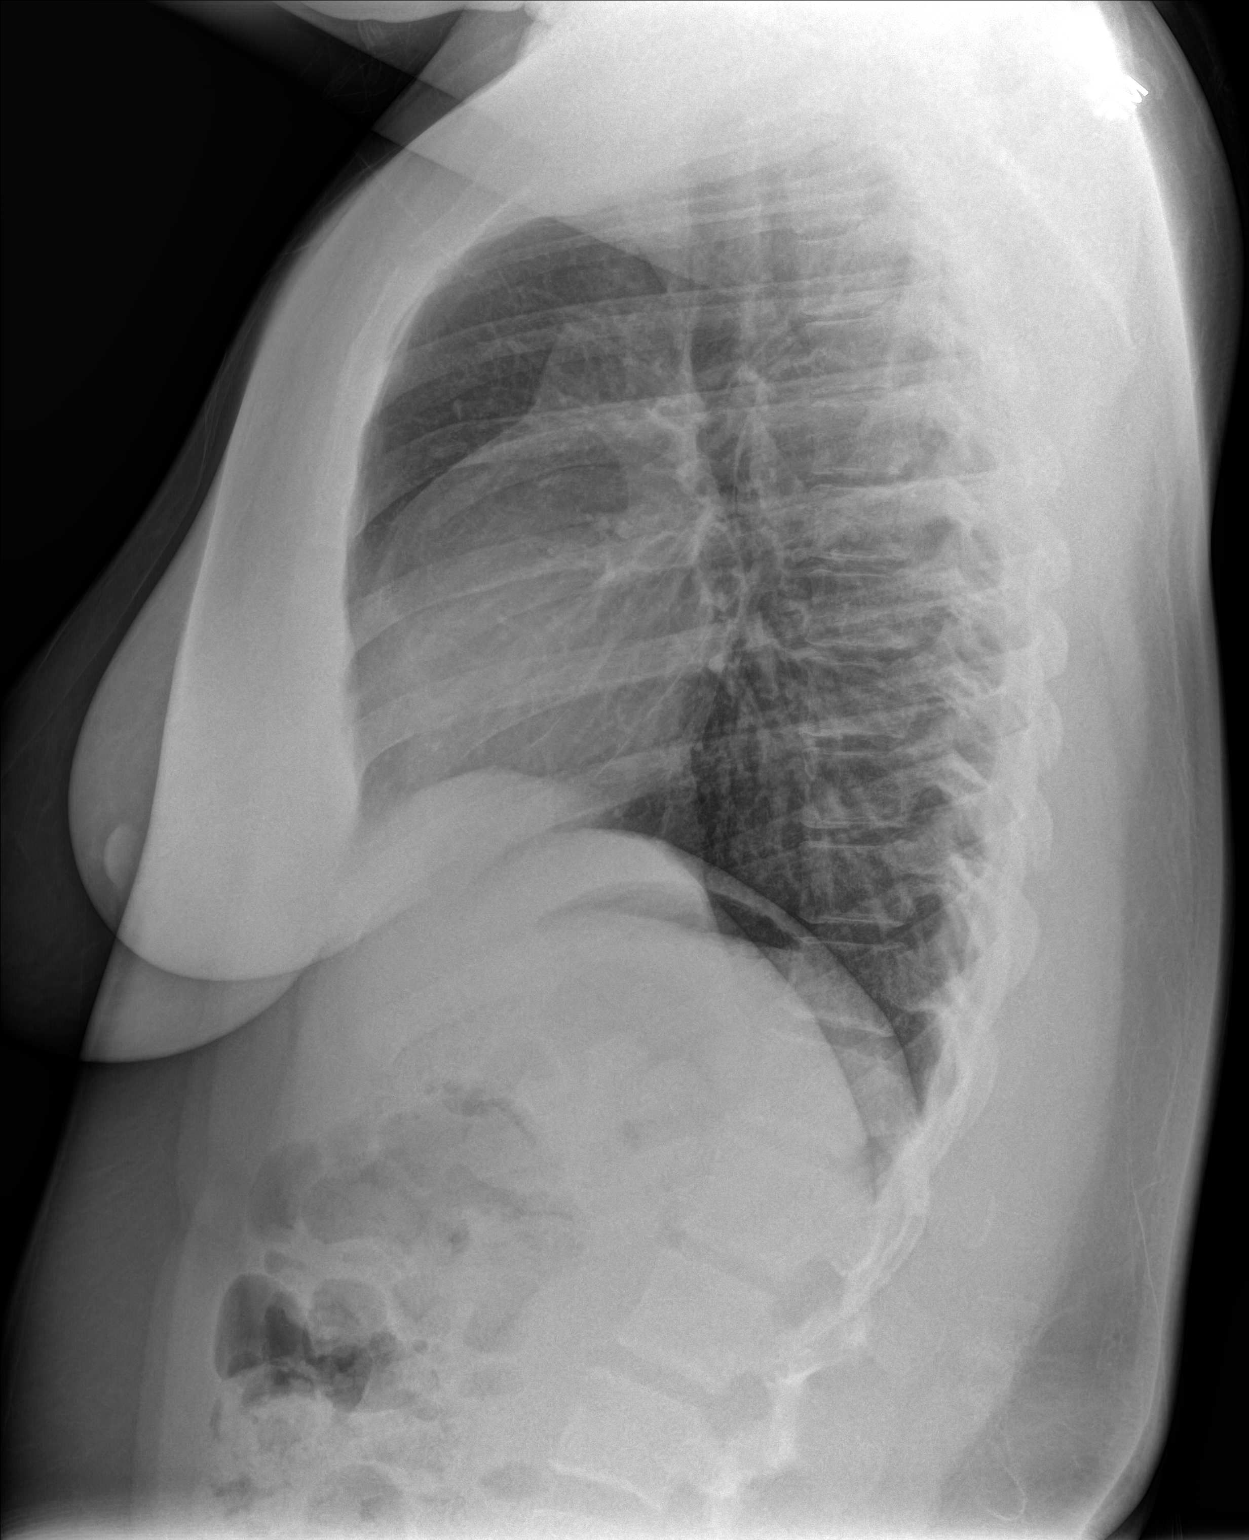

[2 of 2 positions shown; findings below may reference images not displayed]

FINDINGS: Trachea is midline. Heart size normal. Lungs are clear. No pleural
fluid.
IMPRESSION: Negative.
